# Patient Record
Sex: Female | Born: 1980 | Race: Black or African American | Hispanic: No | Marital: Single | Smoking: Never smoker
Health system: Southern US, Community
[De-identification: ages and names within clinical notes are randomized; demographics above are authoritative.]

## PROBLEM LIST (undated history)

## (undated) HISTORY — PX: TUBAL LIGATION: SHX77

---

## 2001-08-23 ENCOUNTER — Encounter: Payer: Self-pay | Admitting: Obstetrics and Gynecology

## 2001-08-23 ENCOUNTER — Ambulatory Visit (HOSPITAL_COMMUNITY): Admission: RE | Admit: 2001-08-23 | Discharge: 2001-08-23 | Payer: Self-pay | Admitting: Obstetrics and Gynecology

## 2001-12-03 ENCOUNTER — Ambulatory Visit (HOSPITAL_COMMUNITY): Admission: RE | Admit: 2001-12-03 | Discharge: 2001-12-03 | Payer: Self-pay | Admitting: Obstetrics and Gynecology

## 2001-12-03 ENCOUNTER — Encounter: Payer: Self-pay | Admitting: Obstetrics and Gynecology

## 2002-01-06 ENCOUNTER — Inpatient Hospital Stay (HOSPITAL_COMMUNITY): Admission: AD | Admit: 2002-01-06 | Discharge: 2002-01-06 | Payer: Self-pay | Admitting: Obstetrics and Gynecology

## 2002-01-10 ENCOUNTER — Encounter (HOSPITAL_COMMUNITY): Admission: RE | Admit: 2002-01-10 | Discharge: 2002-02-09 | Payer: Self-pay | Admitting: Obstetrics and Gynecology

## 2002-01-13 ENCOUNTER — Encounter: Payer: Self-pay | Admitting: Obstetrics and Gynecology

## 2002-01-21 ENCOUNTER — Inpatient Hospital Stay (HOSPITAL_COMMUNITY): Admission: AD | Admit: 2002-01-21 | Discharge: 2002-01-27 | Payer: Self-pay | Admitting: Obstetrics and Gynecology

## 2002-02-04 ENCOUNTER — Encounter: Payer: Self-pay | Admitting: Obstetrics and Gynecology

## 2002-02-04 ENCOUNTER — Ambulatory Visit (HOSPITAL_COMMUNITY): Admission: RE | Admit: 2002-02-04 | Discharge: 2002-02-04 | Payer: Self-pay | Admitting: Obstetrics and Gynecology

## 2002-03-04 ENCOUNTER — Other Ambulatory Visit: Admission: RE | Admit: 2002-03-04 | Discharge: 2002-03-04 | Payer: Self-pay | Admitting: Obstetrics and Gynecology

## 2002-10-07 ENCOUNTER — Ambulatory Visit (HOSPITAL_COMMUNITY): Admission: RE | Admit: 2002-10-07 | Discharge: 2002-10-07 | Payer: Self-pay | Admitting: *Deleted

## 2003-01-31 ENCOUNTER — Inpatient Hospital Stay (HOSPITAL_COMMUNITY): Admission: AD | Admit: 2003-01-31 | Discharge: 2003-02-02 | Payer: Self-pay | Admitting: *Deleted

## 2005-07-01 ENCOUNTER — Emergency Department (HOSPITAL_COMMUNITY): Admission: EM | Admit: 2005-07-01 | Discharge: 2005-07-01 | Payer: Self-pay | Admitting: Emergency Medicine

## 2005-07-22 ENCOUNTER — Emergency Department (HOSPITAL_COMMUNITY): Admission: EM | Admit: 2005-07-22 | Discharge: 2005-07-22 | Payer: Self-pay | Admitting: Emergency Medicine

## 2005-09-16 ENCOUNTER — Ambulatory Visit (HOSPITAL_COMMUNITY): Admission: RE | Admit: 2005-09-16 | Discharge: 2005-09-16 | Payer: Self-pay | Admitting: *Deleted

## 2005-10-08 ENCOUNTER — Ambulatory Visit (HOSPITAL_COMMUNITY): Admission: RE | Admit: 2005-10-08 | Discharge: 2005-10-08 | Payer: Self-pay | Admitting: *Deleted

## 2005-12-23 ENCOUNTER — Emergency Department (HOSPITAL_COMMUNITY): Admission: EM | Admit: 2005-12-23 | Discharge: 2005-12-23 | Payer: Self-pay | Admitting: Emergency Medicine

## 2006-02-03 ENCOUNTER — Ambulatory Visit: Payer: Self-pay | Admitting: *Deleted

## 2006-02-03 ENCOUNTER — Ambulatory Visit (HOSPITAL_COMMUNITY): Admission: RE | Admit: 2006-02-03 | Discharge: 2006-02-03 | Payer: Self-pay | Admitting: Obstetrics and Gynecology

## 2006-02-10 ENCOUNTER — Ambulatory Visit: Payer: Self-pay | Admitting: *Deleted

## 2006-02-12 ENCOUNTER — Ambulatory Visit: Payer: Self-pay | Admitting: Obstetrics and Gynecology

## 2006-02-12 ENCOUNTER — Inpatient Hospital Stay (HOSPITAL_COMMUNITY): Admission: AD | Admit: 2006-02-12 | Discharge: 2006-02-12 | Payer: Self-pay | Admitting: Obstetrics & Gynecology

## 2006-02-15 ENCOUNTER — Ambulatory Visit: Payer: Self-pay | Admitting: Obstetrics and Gynecology

## 2006-02-15 ENCOUNTER — Inpatient Hospital Stay (HOSPITAL_COMMUNITY): Admission: AD | Admit: 2006-02-15 | Discharge: 2006-02-18 | Payer: Self-pay | Admitting: Family Medicine

## 2006-02-16 ENCOUNTER — Ambulatory Visit: Payer: Self-pay | Admitting: Obstetrics and Gynecology

## 2006-09-16 IMAGING — US US OB TRANSVAGINAL MODIFY
1 series · 14 of 28 positions shown · non-contrast
Comparison: none

CLINICAL DATA: 24-year-old female with positive pregnancy test and pelvic pain with severe nausea and vomiting.  
 TRANSABDOMINAL AND TRANSVAGINAL COMPLETE OB ULTRASOUND < 14 WEEKS:

[Series 1: unknown · 0.27mm/px · 14 of 31 slices shown]
[im 2/31]
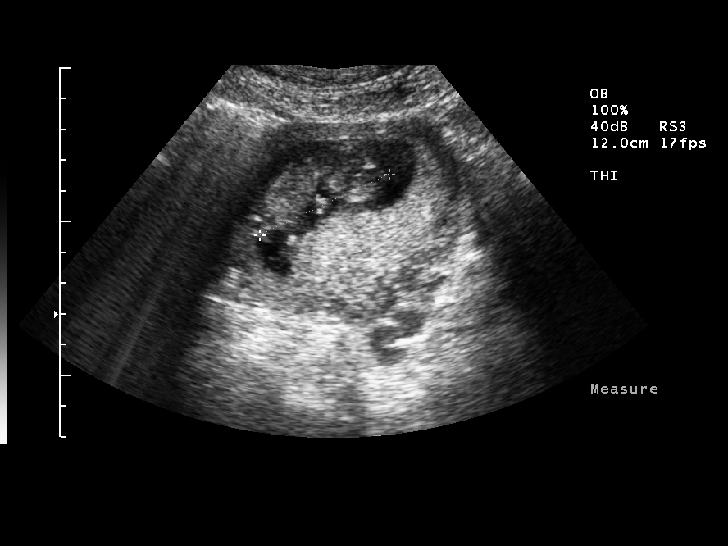
[im 4/31]
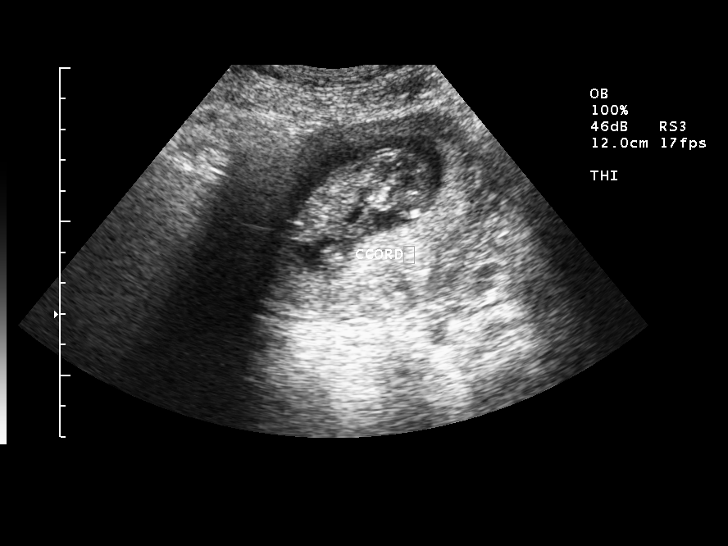
[im 6/31]
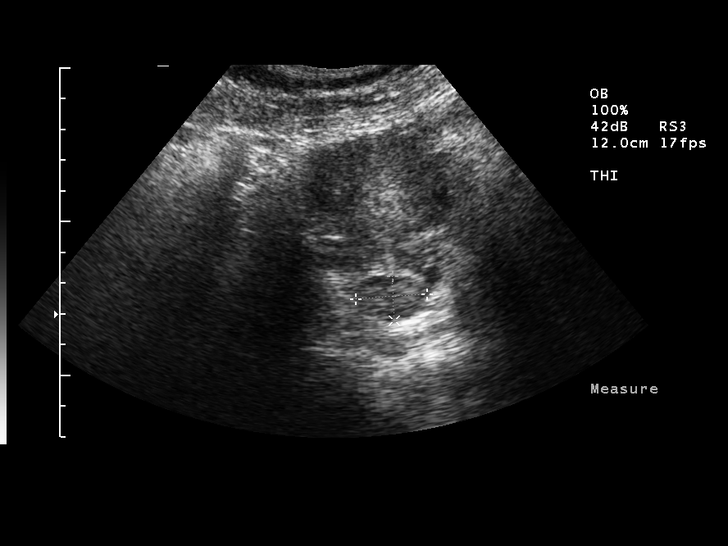
[im 8/31]
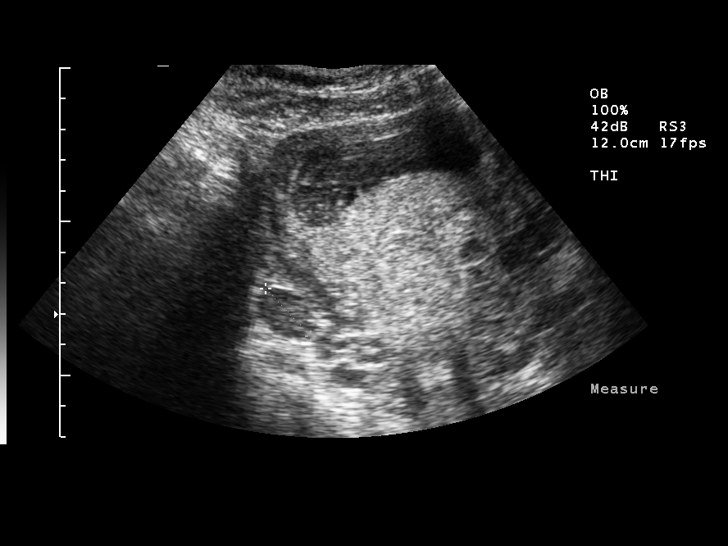
[im 11/31]
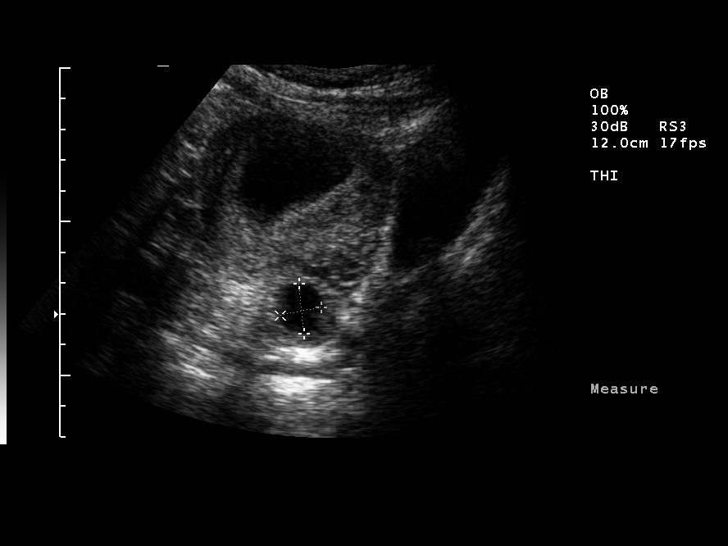
[im 13/31]
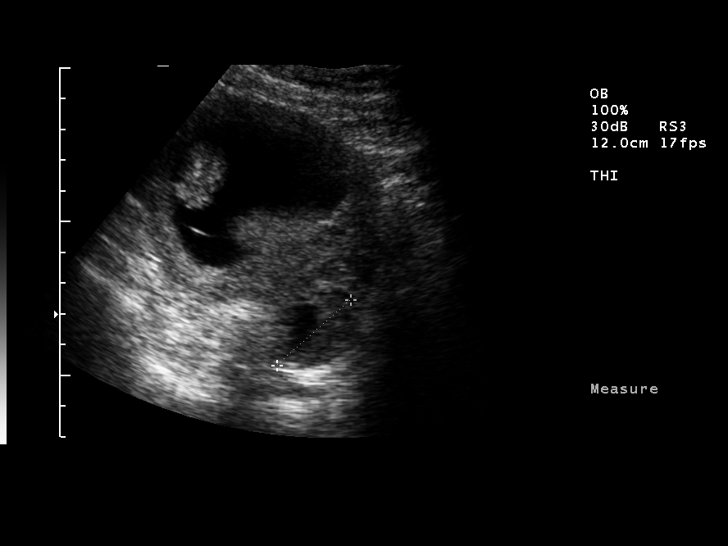
[im 15/31]
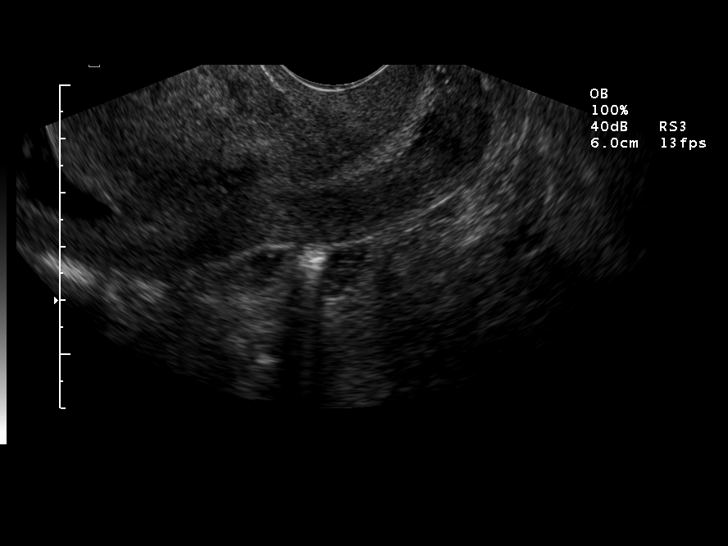
[im 17/31]
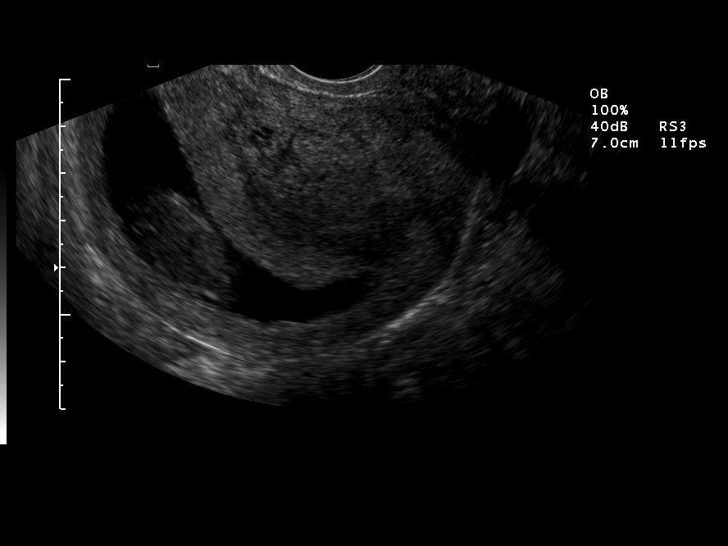
[im 19/31]
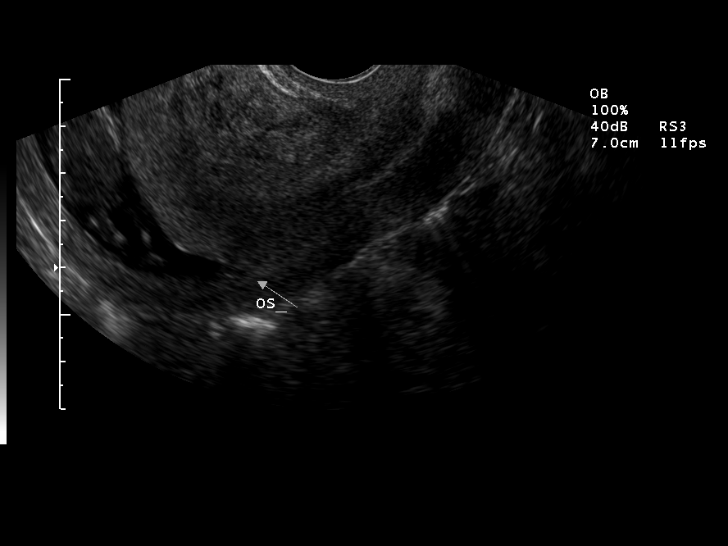
[im 22/31]
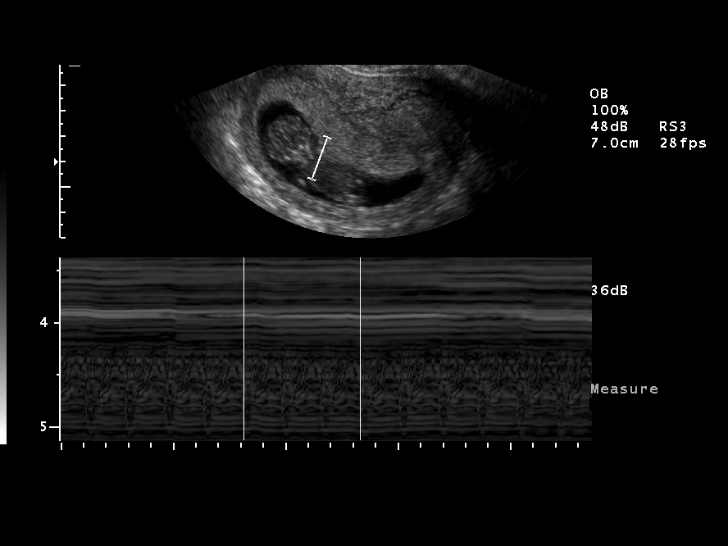
[im 24/31]
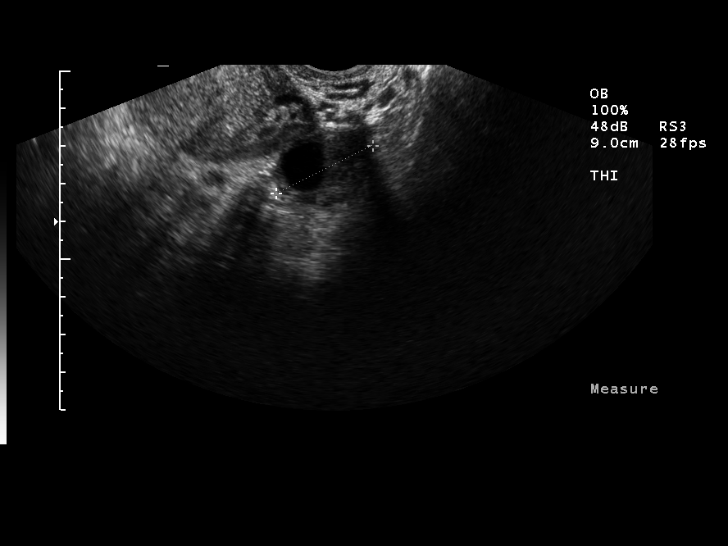
[im 26/31]
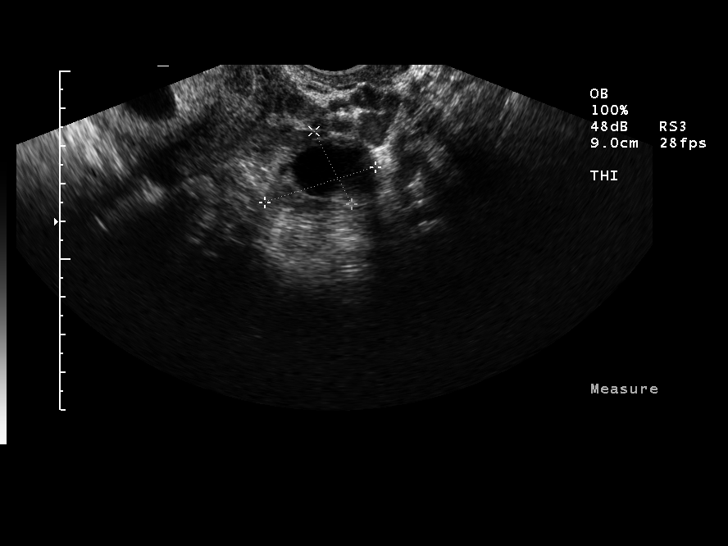
[im 28/31]
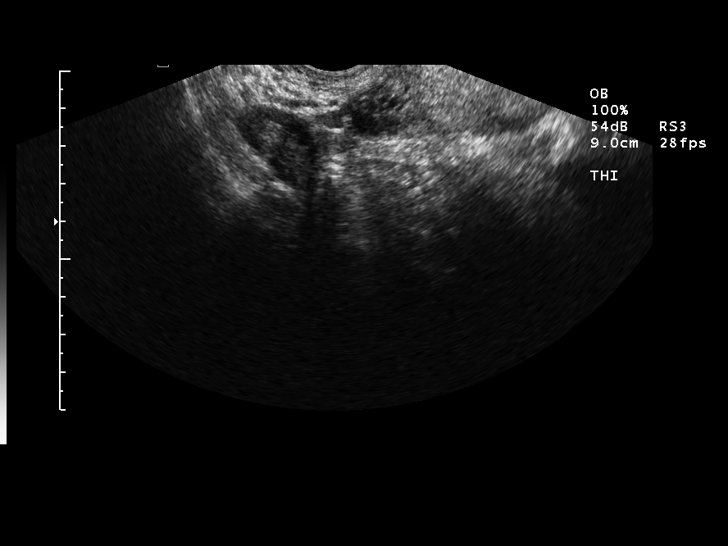
[im 31/31]
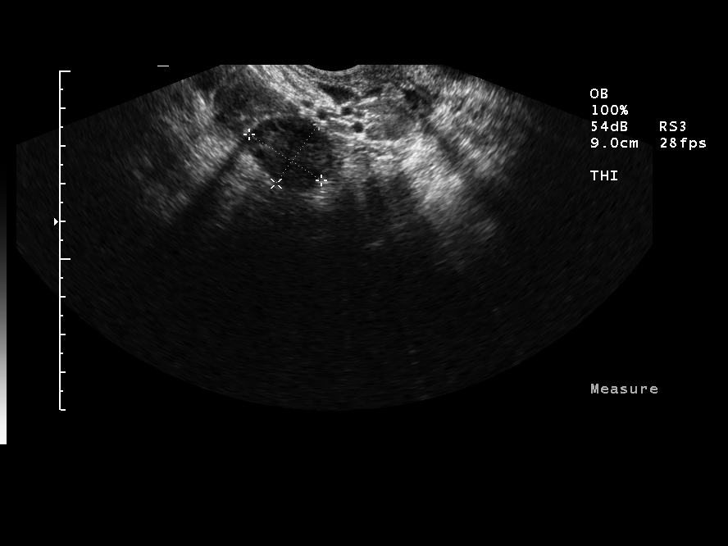

[14 of 28 positions shown; findings below may reference images not displayed]

FINDINGS: There is a single intrauterine gestation with crown rump length of 4.8 cm corresponding to a gestational age of 11 weeks 4 days.  Fetal heart rate measures 174 beats per minute.  Anterior low-lying placenta is identified extending to the margin of the internal os.  Normal amniotic fluid volume is identified.  There is no evidence of subchorionic hemorrhage.  The gestation is grossly unremarkable but please note this was not performed as a screening study.  Right ovary is normal.  Simple 1.6 cm cyst in the left ovary is noted.  No adnexal masses or free fluid identified.
IMPRESSION: 1.  Single living intrauterine gestation with estimated gestational age of 11 weeks 4 days by this ultrasound.  
 2.  No evidence of acute abnormality or subchorionic hemorrhage.  
 3.  Anterior low lying placenta to the margin of the internal os.  Follow-up recommended.
 4.  Please note that this was not performed as anatomy screen and recommend full evaluation 18-22 weeks.
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
## 2009-10-12 ENCOUNTER — Emergency Department (HOSPITAL_COMMUNITY): Admission: EM | Admit: 2009-10-12 | Discharge: 2009-10-13 | Payer: Self-pay | Admitting: Emergency Medicine

## 2010-04-26 ENCOUNTER — Emergency Department (HOSPITAL_COMMUNITY): Admission: EM | Admit: 2010-04-26 | Discharge: 2010-04-26 | Payer: Self-pay | Admitting: Emergency Medicine

## 2010-11-02 ENCOUNTER — Emergency Department (HOSPITAL_COMMUNITY): Admission: EM | Admit: 2010-11-02 | Discharge: 2010-11-02 | Payer: Self-pay | Admitting: Emergency Medicine

## 2011-03-20 LAB — URINALYSIS, ROUTINE W REFLEX MICROSCOPIC
Bilirubin Urine: NEGATIVE
Ketones, ur: NEGATIVE mg/dL
Nitrite: NEGATIVE

## 2011-03-20 LAB — POCT PREGNANCY, URINE: Preg Test, Ur: NEGATIVE

## 2011-03-20 LAB — URINE MICROSCOPIC-ADD ON

## 2011-05-02 NOTE — Discharge Summary (Signed)
[facility]  Patient:    Molly Clayton, Molly Clayton Visit Number: [id number] MRN: [id number]          Service Type: OBS Location: [facility] Attending Physician:  Malon Kindle Dictated by:   Malachi Pro. Ambrose Mantle, M.D. Admit Date:  01/21/2002 Disc. Date: 01/27/02                             Discharge Summary  HISTORY:                      This is a 30 year old black single female, para 0, gravida 1, last period Apr 22, 2001, [facility] January 23, 2002 by ultrasound and January 30, 2002 by dates, admitted in early labor.  PRENATAL LABORATORY DATA:     Blood group and type O positive with a negative antibody. Sickle cell positive; electrophoresis Hemoglobin AS. RPR nonreactive, rubella immune, hepatitis B surface antigen negative, HIV negative, GC and Chlamydia negative, triple screen normal. One-hour glucola 100. Group B strep negative. Cystic fibrosis screen negative.  Vaginal ultrasound on June 28, 2001: Crown-rump length 3.23 cm, 10 weeks    1 day, [facility] January 23, 2002. Repeat ultrasound August 23, 2001: Mean gestational age [redacted] weeks 5 days, [facility] January 19, 2002.  The patient had reactive nonstress tests for size less than dates. Late ultrasound showed normal amniotic fluid index and estimated fetal weight of 2900 g on January 13, 2002.  The patient came to Maternity Admission and the cervix changed under observation from 3 cm to 4 cm.  PAST MEDICAL HISTORY:  ALLERGIES:                    No known drug allergies.  OPERATIONS:                   None.  ILLNESSES:                    Usual childhood diseases. The patient had had Trichomonas and Chlamydia.  FAMILY HISTORY:               Paternal cousin with no fingers on one hand. Paternal aunt, Downs syndrome.  ALCOHOL, TOBACCO, AND DRUGS:  None.  PHYSICAL EXAMINATION:         Physical exam revealed normal vital signs. The abdomen showed a fundal height of 35 cm on January 20, 2002. Fetal heart  tones were normal. Contractions were coming every seven to eight minutes. The cervix was 4 cm, 80%, vertex at a -2, slightly posterior cervix. Artificial rupture of the membranes produced slightly meconium-stained fluid.  ADMISSION IMPRESSIONS:        1. Intrauterine pregnancy at 39+ weeks.                               2. Early labor.                               3. Meconium-stained fluid.  HOSPITAL COURSE:              By 11:40 a.m. the patient had received Stadol and Phenergan. The cervix was 6 cm. Epidural was in place. She was on low-dose Pitocin. By 5:55 p.m. the cervix was 10 cm. The vertex was at a +2 station. The patient pushed well  and had a spontaneous vaginal delivery of a living female infant, 6 pounds 9 ounces, Apgars of 9 at one and 9 at five minutes over a second degree laceration. Dr. Jackelyn Knife was in attendance. Nuchal cord x 1 was reduced. DeLee and bulb were used on the perineum. Clear fluid was obtained. The placenta was spontaneous. Membranes were removed with ring forceps. Second degree laceration was repaired with 3-0 Vicryl. Blood loss was less than 500 cc.  Postpartum, the patient did well until the evening of the first postpartum day when she did have sweats. Her temperature went to 100.4 degrees. On the evening of the second postpartum day, her temperature rose to 101.6 and she was begun on Unasyn. She actually felt fine. On the fourth postpartum day, her temperature was still 100.2 orally. Her only complaints were shakes and sweats and breast tenderness. There was no sore throat or leg pain. Heart showed normal sinus rhythm, no murmurs. Lungs were clear to P&A. Breasts showed bilateral engorgement and tenderness. There was no CVA tenderness. The abdomen was soft and nontender. The uterus was not tender. Legs were negative. The patient continued to be febrile so Dr. Senaida Ores placed her on gentamicin to add to the Unasyn and subsequently, the patients  temperature returned to normal and remained normal throughout the rest of her hospital course. She did state that she had some shakes and sweat at about 4 a.m. of the day of discharge; however, her temperature was not taken at that time.  On admission, her hemoglobin was 11.5, hematocrit 34.5, white count 9800, platelet count 191,000. Followup hemoglobin 10.7, hematocrit 32.3, white count 8200, platelet count 183,000. Urinalysis done on January 23, 2002 was negative. RPR was nonreactive.  FINAL DIAGNOSES:              1. Intrauterine pregnancy at 39 weeks, delivered                                  vertex.                               2. Meconium-stained fluid.                               3. Postpartum fever, uncertain origin.  OPERATIONS:                   Spontaneous vaginal delivery with repair                               of small laceration.  FINAL CONDITION:              Improved.  DISCHARGE INSTRUCTIONS:       Instructions include regular diet, our regular discharge instructions. She is to report any fever above 100 degrees, report any unusual problems.  DISCHARGE MEDICATIONS:        Augmentin 500 mg p.o. q.8h. for three days.  DISCHARGE FOLLOWUP:           The patient is to return to the office in one week for followup examination. Dictated by:   Malachi Pro. Ambrose Mantle, M.D. Attending Physician:  Malon Kindle DD:  01/27/02 TD:  01/27/02 Job: [id number]

## 2011-05-02 NOTE — Op Note (Signed)
Molly Clayton, Molly Clayton              ACCOUNT NO.:  [id number]   MEDICAL RECORD NO.:  [id number]          PATIENT TYPE:  INP   LOCATION:  9147                          FACILITY:  [facility]   PHYSICIAN:  Phil D. Okey Dupre, M.D.     DATE OF BIRTH:  May 25, 1981   DATE OF PROCEDURE:  02/17/2006  DATE OF DISCHARGE:                                 OPERATIVE REPORT   PREOPERATIVE DIAGNOSIS:  Multiparity, desires permanent sterilization.   POSTOPERATIVE DIAGNOSIS:  Multiparity, desires permanent sterilization.   PROCEDURE:  Postpartum tubal ligation with Filshie clips.   SURGEON:  Elinor Dodge, M.D.   ASSISTANT:  Carolanne Grumbling, M.D.   ANESTHESIA:  Spinal.   COMPLICATIONS:  None.   ESTIMATED BLOOD LOSS:  Minimal.   FLUIDS:  1000 mL of lactated Ringer's.   INDICATIONS:  The patient is a 30 year old G3, P3 status post normal  spontaneous vaginal delivery who desires permanent sterilization.  Risks and  benefits of the procedure were discussed with the patient including risk of  failure of 3-04/999 with increased risk of the ectopic gestation if  pregnancy occurs.   FINDINGS:  Normal uterus, tubes and ovaries.   DESCRIPTION OF PROCEDURE:  The patient was taken to the operating room where  a spinal anesthesia was placed and found to be adequate.  A small transverse  infraumbilical skin incision was then made with a scalpel.  The incision was  then carried down through the underlying fascia until the peritoneum was  identified and entered.  The peritoneum was noted be free of any an  adhesions, and the incision was extended with Metzenbaum scissors.  The  patient's right fallopian tube was then identified, brought through the  incision and grasped with Babcock clamps.  The tube was followed out to the  fimbriae.  Babcock clamp was then used to grasp the tube approximately 4 cm  from the cornual region.  The Filshie clip was placed.  It appeared to close  off the entire tube.  However, there was one  small fold so a second clip was  placed just to completely ensure that the procedure was adequate.  procedure  was adequate. Next, attention was turned to the left fallopian tube and the  tube was grasped with Babcock clamps and followed out to the fimbria.  Babcock clamp was then used to grasp the tube approximately 4 cm from the  cornual region and a Filshie clip was placed.  There was excellent  hemostasis, and the tubes were returned to the abdomen.   The peritoneum and fascia were closed with in a closed single layer using O  Vicryl.  The skin was closed in a subcuticular fashion using 4-0 PS Vicryl.   The patient tolerated the procedure well.  Sponge, lap and needle counts  were correct x2. The patient was taken to the recovery room in stable  condition.     ______________________________  Marc Morgans Mayford Knife, M.D.    ______________________________  Javier Glazier. Okey Dupre, M.D.    TLW/MEDQ  D:  02/17/2006  T:  02/18/2006  Job:  [id number]

## 2013-06-10 ENCOUNTER — Other Ambulatory Visit (HOSPITAL_COMMUNITY)
Admission: RE | Admit: 2013-06-10 | Discharge: 2013-06-10 | Disposition: A | Discharge status: Home / Self Care | Payer: No Typology Code available for payment source | Source: Ambulatory Visit | Attending: Emergency Medicine | Admitting: Emergency Medicine

## 2013-06-10 ENCOUNTER — Encounter (HOSPITAL_COMMUNITY): Payer: Self-pay

## 2013-06-10 ENCOUNTER — Emergency Department (INDEPENDENT_AMBULATORY_CARE_PROVIDER_SITE_OTHER)
Admission: EM | Admit: 2013-06-10 | Discharge: 2013-06-10 | Disposition: A | Discharge status: Home / Self Care | Payer: No Typology Code available for payment source | Source: Home / Self Care

## 2013-06-10 DIAGNOSIS — R102 Pelvic and perineal pain: Secondary | ICD-10-CM

## 2013-06-10 DIAGNOSIS — N760 Acute vaginitis: Secondary | ICD-10-CM | POA: Insufficient documentation

## 2013-06-10 DIAGNOSIS — N946 Dysmenorrhea, unspecified: Secondary | ICD-10-CM

## 2013-06-10 DIAGNOSIS — N9489 Other specified conditions associated with female genital organs and menstrual cycle: Secondary | ICD-10-CM

## 2013-06-10 DIAGNOSIS — Z113 Encounter for screening for infections with a predominantly sexual mode of transmission: Secondary | ICD-10-CM | POA: Insufficient documentation

## 2013-06-10 DIAGNOSIS — N949 Unspecified condition associated with female genital organs and menstrual cycle: Secondary | ICD-10-CM

## 2013-06-10 LAB — POCT URINALYSIS DIP (DEVICE)
Nitrite: NEGATIVE
Specific Gravity, Urine: 1.015 (ref 1.005–1.030)

## 2013-06-10 LAB — POCT PREGNANCY, URINE: Preg Test, Ur: NEGATIVE

## 2013-06-10 MED ORDER — AZITHROMYCIN 250 MG PO TABS: ORAL_TABLET | ORAL | Status: AC

## 2013-06-10 MED ORDER — CEFTRIAXONE SODIUM 1 G IJ SOLR
INTRAMUSCULAR | Status: AC
  Filled 2013-06-10: qty 10

## 2013-06-10 MED ORDER — CEFTRIAXONE SODIUM 250 MG IJ SOLR
INTRAMUSCULAR | Status: AC
  Filled 2013-06-10: qty 250

## 2013-06-10 MED ORDER — CEFTRIAXONE SODIUM 250 MG IJ SOLR
250.0000 mg | Freq: Once | INTRAMUSCULAR | Status: AC
  Administered 2013-06-10: 250 mg via INTRAMUSCULAR

## 2013-06-10 MED ORDER — NAPROXEN 500 MG PO TBEC: 500.0000 mg | DELAYED_RELEASE_TABLET | Freq: Two times a day (BID) | ORAL | Status: AC

## 2013-06-10 NOTE — ED Provider Notes (Addendum)
History    CSN: [id number] Arrival date & time 06/10/13  1413  First MD Initiated Contact with Patient 06/10/13 1507     Chief Complaint  Patient presents with  . Abdominal Pain   (Consider location/radiation/quality/duration/timing/severity/associated sxs/prior Treatment) HPI Comments: 32 year old female presents with right pelvic pain since last night. She says it became worse this morning. She began her menses 2 days ago and this was followed by suprapubic cramping. She is complaining of mild nausea but no vomiting or fever. She states she has a normal appetite. Denies urinary symptoms. Pregnancy test is negative  History reviewed. No pertinent past medical history. History reviewed. No pertinent past surgical history. No family history on file. History  Substance Use Topics  . Smoking status: Not on file  . Smokeless tobacco: Not on file  . Alcohol Use: Not on file   OB History   Grav Para Term Preterm Abortions TAB SAB Ect Mult Living                 Review of Systems  Constitutional: Positive for activity change. Negative for fever.  HENT: Negative.   Respiratory: Negative.   Cardiovascular: Negative.   Gastrointestinal: Positive for nausea and abdominal pain. Negative for vomiting and constipation.  Genitourinary: Positive for vaginal bleeding and pelvic pain. Negative for dysuria, difficulty urinating and menstrual problem.  Skin: Negative.   Neurological: Negative.     Allergies  Review of patient's allergies indicates no known allergies.  Home Medications   Current Outpatient Rx  Name  Route  Sig  Dispense  Refill  . azithromycin (ZITHROMAX) 250 MG tablet      Take all 4 tablets today   4 each   0   . naproxen (EC-NAPROSYN) 500 MG EC tablet   Oral   Take 1 tablet (500 mg total) by mouth 2 (two) times daily with a meal. Prn cramps.   16 tablet   0    BP 122/78  Pulse 76  Temp(Src) 98.5 F (36.9 C) (Oral)  Resp 16  SpO2 100% Physical Exam   Nursing note and vitals reviewed. Constitutional: She is oriented to person, place, and time. She appears well-developed and well-nourished. No distress.  Neck: Normal range of motion. Neck supple.  Cardiovascular: Normal rate, regular rhythm and normal heart sounds.   Pulmonary/Chest: Effort normal and breath sounds normal. No respiratory distress.  Abdominal: Soft. Bowel sounds are normal. She exhibits no distension and no mass. There is tenderness. There is no rebound and no guarding.  Tenderness in the lower most right quadrant and pelvis. Also tenderness in the mid pelvis/suprapubic area.  Genitourinary:  Moderate bleeding associated with menses. Cervix is midposition posterior in the vagina and cervix is filled with blood. After removal of some of the blood the ectocervix is barely visible there were no lesions observed. The ectocervix that was visible and the cervix was pain. Bimanual: Mild CMT and moderate right adnexal tenderness. Reevaluation of the pain associated with the bimanual exam with compression of the right lower quadrant of the abdomen and does not produce pain or tenderness.  Neurological: She is alert and oriented to person, place, and time.  Skin: Skin is warm and dry.  Psychiatric: She has a normal mood and affect.    ED Course  Procedures (including critical care time) Labs Reviewed  POCT URINALYSIS DIP (DEVICE) - Abnormal; Notable for the following:    Hgb urine dipstick MODERATE (*)    Leukocytes, UA TRACE (*)  All other components within normal limits  POCT PREGNANCY, URINE  CERVICOVAGINAL ANCILLARY ONLY   Results for orders placed during the hospital encounter of 06/10/13  POCT URINALYSIS DIP (DEVICE)      Result Value Range   Glucose, UA NEGATIVE  NEGATIVE mg/dL   Bilirubin Urine NEGATIVE  NEGATIVE   Ketones, ur NEGATIVE  NEGATIVE mg/dL   Specific Gravity, Urine 1.015  1.005 - 1.030   Hgb urine dipstick MODERATE (*) NEGATIVE   pH 6.5  5.0 - 8.0    Protein, ur NEGATIVE  NEGATIVE mg/dL   Urobilinogen, UA 0.2  0.0 - 1.0 mg/dL   Nitrite NEGATIVE  NEGATIVE   Leukocytes, UA TRACE (*) NEGATIVE  POCT PREGNANCY, URINE      Result Value Range   Preg Test, Ur NEGATIVE  NEGATIVE    No results found. 1. Pelvic pain   2. Uterine adnexal pain   3. Painful menstrual flow     MDM  The physical exam and in particular the pelvic exam is not suggestive of right lower quadrant/appendiceal tenderness or pain. The pain and tenderness is arising from the pelvic structures. Treat with Rocephin 250 mg IM and Rx for azithromycin 1 g by mouth Cervical ancillary swabs obtained. Results pending and will treat accordingly. Per any new symptoms problems or worsening, worsening of pain, nausea vomiting, fever or other symptoms go directly to the emergency department. Info for abdominal pain and possible appendicitis is given to to the pt.  Naprosyn ec 500 bid with food prn cramps and pain. Flagyl BID for 7 d. Note entered 06/16/13. DM  Hayden Rasmussen, NP 06/10/13 1627  Hayden Rasmussen, NP 06/16/13 [phone]

## 2013-06-10 NOTE — ED Notes (Addendum)
C/o pain RLQ  w nausea. Menses at present, LMP prior to this one was 6 weeks ago; sexually active, tubal ligation for [facility].

## 2013-06-10 NOTE — ED Provider Notes (Signed)
Medical screening examination/treatment/procedure(s) were performed by non-physician practitioner and as supervising physician I was immediately available for consultation/collaboration.  Leslee Home, M.D.  Reuben Likes, MD 06/10/13 [phone]

## 2013-06-14 ENCOUNTER — Telehealth (HOSPITAL_COMMUNITY): Payer: Self-pay | Admitting: *Deleted

## 2013-06-14 NOTE — ED Notes (Signed)
GC/Chlamydia neg., Affirm: Candida and Trich neg., Gardnerella pos. Hayden Rasmussen NP gave order for Flagyl 500 mg. BID x 7 days.  I called pt.  Pt. verified x 2 and given results.  Pt. told she needs Flagyl for bacterial vaginosis.  Pt.'s questions about bacterial vaginosis answered.   Pt. instructed to no alcohol while taking this medication.  Pt. wants Rx. called to [facility] on [address]. Pt. voiced understanding.  I called pharmacist @ [phone] and gave order as previously noted. Vassie Moselle 06/14/2013

## 2013-06-16 NOTE — ED Provider Notes (Signed)
Medical screening examination/treatment/procedure(s) were performed by non-physician practitioner and as supervising physician I was immediately available for consultation/collaboration.  Arlee Bossard, M.D.  Porsche Noguchi C Hamdan Toscano, MD 06/16/13 1935 

## 2014-11-20 ENCOUNTER — Encounter (HOSPITAL_COMMUNITY): Payer: Self-pay

## 2014-11-20 ENCOUNTER — Emergency Department (HOSPITAL_COMMUNITY)
Admission: EM | Admit: 2014-11-20 | Discharge: 2014-11-21 | Disposition: A | Discharge status: Home / Self Care | Payer: No Typology Code available for payment source | Attending: Emergency Medicine | Admitting: Emergency Medicine

## 2014-11-20 ENCOUNTER — Emergency Department (HOSPITAL_COMMUNITY): Payer: No Typology Code available for payment source

## 2014-11-20 DIAGNOSIS — Z792 Long term (current) use of antibiotics: Secondary | ICD-10-CM | POA: Insufficient documentation

## 2014-11-20 DIAGNOSIS — Z791 Long term (current) use of non-steroidal anti-inflammatories (NSAID): Secondary | ICD-10-CM | POA: Insufficient documentation

## 2014-11-20 DIAGNOSIS — R0789 Other chest pain: Secondary | ICD-10-CM

## 2014-11-20 DIAGNOSIS — R202 Paresthesia of skin: Secondary | ICD-10-CM

## 2014-11-20 DIAGNOSIS — Z9851 Tubal ligation status: Secondary | ICD-10-CM | POA: Diagnosis not present

## 2014-11-20 DIAGNOSIS — N938 Other specified abnormal uterine and vaginal bleeding: Secondary | ICD-10-CM | POA: Insufficient documentation

## 2014-11-20 DIAGNOSIS — R079 Chest pain, unspecified: Secondary | ICD-10-CM

## 2014-11-20 LAB — BASIC METABOLIC PANEL
Anion gap: 14 (ref 5–15)
BUN: 8 mg/dL (ref 6–23)
CALCIUM: 9.1 mg/dL (ref 8.4–10.5)
CHLORIDE: 102 meq/L (ref 96–112)
CO2: 23 mEq/L (ref 19–32)
CREATININE: 0.95 mg/dL (ref 0.50–1.10)
GFR calc Af Amer: 89 mL/min — ABNORMAL LOW (ref >90)
GFR calc non Af Amer: 77 mL/min — ABNORMAL LOW (ref >90)
GLUCOSE: 103 mg/dL — AB (ref 70–99)
POTASSIUM: 3.8 meq/L (ref 3.7–5.3)
SODIUM: 139 meq/L (ref 137–147)

## 2014-11-20 LAB — CBC
HCT: 35.3 % — ABNORMAL LOW (ref 36.0–46.0)
Hemoglobin: 12.1 g/dL (ref 12.0–15.0)
MCH: 28.2 pg (ref 26.0–34.0)
MCHC: 34.3 g/dL (ref 30.0–36.0)
MCV: 82.3 fL (ref 78.0–100.0)
PLATELETS: 252 10*3/uL (ref 150–400)
RBC: 4.29 MIL/uL (ref 3.87–5.11)
RDW: 13.4 % (ref 11.5–15.5)
WBC: 7.5 10*3/uL (ref 4.0–10.5)

## 2014-11-20 LAB — I-STAT TROPONIN, ED
Troponin i, poc: 0 ng/mL (ref 0.00–0.08)
Troponin i, poc: 0 ng/mL (ref 0.00–0.08)

## 2014-11-20 MED ORDER — GI COCKTAIL ~~LOC~~
30.0000 mL | Freq: Once | ORAL | Status: AC
Start: 2014-11-20 — End: 2014-11-20
  Administered 2014-11-20: 30 mL via ORAL
  Filled 2014-11-20: qty 30

## 2014-11-20 MED ORDER — RANITIDINE HCL 150 MG PO TABS: 150.0000 mg | ORAL_TABLET | Freq: Two times a day (BID) | ORAL | Status: AC | PRN

## 2014-11-20 MED ORDER — SODIUM CHLORIDE 0.9 % IV BOLUS (SEPSIS)
1000.0000 mL | Freq: Once | INTRAVENOUS | Status: AC
  Administered 2014-11-20: 1000 mL via INTRAVENOUS

## 2014-11-20 MED ORDER — MORPHINE SULFATE 4 MG/ML IJ SOLN
4.0000 mg | Freq: Once | INTRAMUSCULAR | Status: AC
  Administered 2014-11-20: 4 mg via INTRAVENOUS
  Filled 2014-11-20: qty 1

## 2014-11-20 MED ORDER — LORAZEPAM 1 MG PO TABS: 1.0000 mg | ORAL_TABLET | Freq: Four times a day (QID) | ORAL | Status: AC | PRN

## 2014-11-20 MED ORDER — LORAZEPAM 2 MG/ML IJ SOLN
0.5000 mg | Freq: Once | INTRAMUSCULAR | Status: AC
  Administered 2014-11-20: 0.5 mg via INTRAVENOUS
  Filled 2014-11-20: qty 1

## 2014-11-20 NOTE — Discharge Instructions (Signed)
Your chest pain doesn't appear to be related to your heart. Your lab values have all been normal and your work up did not reveal a source. It could be caused by indigestion/heartburn therefore try using zantac or maalox as directed as needed for these symptoms. Stay well hydrated. The pain could be related to anxiety, try using ativan as directed as needed but avoid driving or operating heavy machinery while taking this medication. Use the list below to find a regular doctor, and see them within 1 week for ongoing management of your intermittent chest tightness. Return to the ER for changes or worsening symptoms.   Chest Pain (Nonspecific) It is often hard to give a specific diagnosis for the cause of chest pain. There is always a chance that your pain could be related to something serious, such as a heart attack or a blood clot in the lungs. You need to follow up with your health care provider for further evaluation. CAUSES   Heartburn.  Pneumonia or bronchitis.  Anxiety or stress.  Inflammation around your heart (pericarditis) or lung (pleuritis or pleurisy).  A blood clot in the lung.  A collapsed lung (pneumothorax). It can develop suddenly on its own (spontaneous pneumothorax) or from trauma to the chest.  Shingles infection (herpes zoster virus). The chest wall is composed of bones, muscles, and cartilage. Any of these can be the source of the pain.  The bones can be bruised by injury.  The muscles or cartilage can be strained by coughing or overwork.  The cartilage can be affected by inflammation and become sore (costochondritis). DIAGNOSIS  Lab tests or other studies may be needed to find the cause of your pain. Your health care provider may have you take a test called an ambulatory electrocardiogram (ECG). An ECG records your heartbeat patterns over a 24-hour period. You may also have other tests, such as:  Transthoracic echocardiogram (TTE). During echocardiography, sound  waves are used to evaluate how blood flows through your heart.  Transesophageal echocardiogram (TEE).  Cardiac monitoring. This allows your health care provider to monitor your heart rate and rhythm in real time.  Holter monitor. This is a portable device that records your heartbeat and can help diagnose heart arrhythmias. It allows your health care provider to track your heart activity for several days, if needed.  Stress tests by exercise or by giving medicine that makes the heart beat faster. TREATMENT   Treatment depends on what may be causing your chest pain. Treatment may include:  Acid blockers for heartburn.  Anti-inflammatory medicine.  Pain medicine for inflammatory conditions.  Antibiotics if an infection is present.  You may be advised to change lifestyle habits. This includes stopping smoking and avoiding alcohol, caffeine, and chocolate.  You may be advised to keep your head raised (elevated) when sleeping. This reduces the chance of acid going backward from your stomach into your esophagus. Most of the time, nonspecific chest pain will improve within 2-3 days with rest and mild pain medicine.  HOME CARE INSTRUCTIONS   If antibiotics were prescribed, take them as directed. Finish them even if you start to feel better.  For the next few days, avoid physical activities that bring on chest pain. Continue physical activities as directed.  Do not use any tobacco products, including cigarettes, chewing tobacco, or electronic cigarettes.  Avoid drinking alcohol.  Only take medicine as directed by your health care provider.  Follow your health care provider's suggestions for further testing if your chest pain  does not go away.  Keep any follow-up appointments you made. If you do not go to an appointment, you could develop lasting (chronic) problems with pain. If there is any problem keeping an appointment, call to reschedule. SEEK MEDICAL CARE IF:   Your chest pain  does not go away, even after treatment.  You have a rash with blisters on your chest.  You have a fever. SEEK IMMEDIATE MEDICAL CARE IF:   You have increased chest pain or pain that spreads to your arm, neck, jaw, back, or abdomen.  You have shortness of breath.  You have an increasing cough, or you cough up blood.  You have severe back or abdominal pain.  You feel nauseous or vomit.  You have severe weakness.  You faint.  You have chills. This is an emergency. Do not wait to see if the pain will go away. Get medical help at once. Call your local emergency services (911 in [place] Do not drive yourself to the hospital. MAKE SURE YOU:   Understand these instructions.  Will watch your condition.  Will get help right away if you are not doing well or get worse. Document Released: 09/10/2005 Document Revised: 12/06/2013 Document Reviewed: 07/06/2008 [facility] Patient Information 2015 [place], [place]. This information is not intended to replace advice given to you by your health care provider. Make sure you discuss any questions you have with your health care provider.  Paresthesia Paresthesia is an abnormal burning or prickling sensation. This sensation is generally felt in the hands, arms, legs, or feet. However, it may occur in any part of the body. It is usually not painful. The feeling may be described as:  Tingling or numbness.  "Pins and needles."  Skin crawling.  Buzzing.  Limbs "falling asleep."  Itching. Most people experience temporary (transient) paresthesia at some time in their lives. CAUSES  Paresthesia may occur when you breathe too quickly (hyperventilation). It can also occur without any apparent cause. Commonly, paresthesia occurs when pressure is placed on a nerve. The feeling quickly goes away once the pressure is removed. For some people, however, paresthesia is a long-lasting (chronic) condition caused by an underlying disorder. The underlying disorder  may be:  A traumatic, direct injury to nerves. Examples include a:  Broken (fractured) neck.  Fractured skull.  A disorder affecting the brain and spinal cord (central nervous system). Examples include:  Transverse myelitis.  Encephalitis.  Transient ischemic attack.  Multiple sclerosis.  Stroke.  Tumor or blood vessel problems, such as an arteriovenous malformation pressing against the brain or spinal cord.  A condition that damages the peripheral nerves (peripheral neuropathy). Peripheral nerves are not part of the brain and spinal cord. These conditions include:  Diabetes.  Peripheral vascular disease.  Nerve entrapment syndromes, such as carpal tunnel syndrome.  Shingles.  Hypothyroidism.  Vitamin B12 deficiencies.  Alcoholism.  Heavy metal poisoning (lead, arsenic).  Rheumatoid arthritis.  Systemic lupus erythematosus. DIAGNOSIS  Your caregiver will attempt to find the underlying cause of your paresthesia. Your caregiver may:  Take your medical history.  Perform a physical exam.  Order various lab tests.  Order imaging tests. TREATMENT  Treatment for paresthesia depends on the underlying cause. HOME CARE INSTRUCTIONS  Avoid drinking alcohol.  You may consider massage or acupuncture to help relieve your symptoms.  Keep all follow-up appointments as directed by your caregiver. SEEK IMMEDIATE MEDICAL CARE IF:   You feel weak.  You have trouble walking or moving.  You have problems with  speech or vision.  You feel confused.  You cannot control your bladder or bowel movements.  You feel numbness after an injury.  You faint.  Your burning or prickling feeling gets worse when walking.  You have pain, cramps, or dizziness.  You develop a rash. MAKE SURE YOU:  Understand these instructions.  Will watch your condition.  Will get help right away if you are not doing well or get worse. Document Released: 11/21/2002 Document Revised:  02/23/2012 Document Reviewed: 08/22/2011 [facility] Patient Information 2015 [place], [place]. This information is not intended to replace advice given to you by your health care provider. Make sure you discuss any questions you have with your health care provider. Emergency Department Resource Guide 1) Find a Doctor and Pay Out of Pocket Although you won't have to find out who is covered by your insurance plan, it is a good idea to ask around and get recommendations. You will then need to call the office and see if the doctor you have chosen will accept you as a new patient and what types of options they offer for patients who are self-pay. Some doctors offer discounts or will set up payment plans for their patients who do not have insurance, but you will need to ask so you aren't surprised when you get to your appointment.  2) Contact Your Local Health Department Not all health departments have doctors that can see patients for sick visits, but many do, so it is worth a call to see if yours does. If you don't know where your local health department is, you can check in your phone book. The CDC also has a tool to help you locate your state's health department, and many state websites also have listings of all of their local health departments.  3) Find a Walk-in Clinic If your illness is not likely to be very severe or complicated, you may want to try a walk in clinic. These are popping up all over the country in pharmacies, drugstores, and shopping centers. They're usually staffed by nurse practitioners or physician assistants that have been trained to treat common illnesses and complaints. They're usually fairly quick and inexpensive. However, if you have serious medical issues or chronic medical problems, these are probably not your best option.  No Primary Care Doctor: - Call [facility] Connect at  [phone] - they can help you locate a primary care doctor that  accepts your insurance, provides certain  services, etc. - Physician Referral Service- [phone]  Chronic Pain Problems: Organization         Address  Phone   Notes  [facility]  [phone] Patients need to be referred by their primary care doctor.   Medication Assistance: Organization         Address  Phone   Notes  [facility] Medication [facility] [address] [place], [place] [address] [phone] --Must be a resident of [facility] -- Must have NO insurance coverage whatsoever (no Medicaid/ Medicare, etc.) -- The pt. MUST have a primary care doctor that directs their care regularly and follows them in the community   [facility]  [phone]   [employer]  [phone]    Agencies that provide inexpensive medical care: Organization         Address  Phone   Notes  [facility]  [phone]   [facility]    [phone]   [facility] [address] [place], [place] [id number] [phone]) [phone]   [facility] [place] [address], [place] [phone]   [facility]    [phone]) [phone]   [facility]    [phone]) [phone]   [facility]  [address], [place] Phone:  [phone], Fax:  [phone] Hours of Operation:  9 am - 6 pm, M-F.  Also accepts Medicaid/Medicare and self-pay.  [facility]  [address], [place] Phone: [phone]) [phone], Fax: [phone]) [phone]. Hours of Operation:  8:30 am - 5:30 pm, M-F.  Also accepts Medicaid and self-pay.  [facility] [place] [address], [place] Phone: [phone]) [phone]   [facility] [address], [place], [place] [phone], Ext. [phone] Mondays & Thursdays: 7-9 AM.  First 15 patients are seen on a first come, first serve basis.    Medicaid-accepting [place] [facility] Providers:  Organization         Address  Phone   Notes  [facility] [address], [place] [phone]) [phone] Also accepts self-pay patients.  [facility] [address], [place]  [phone]   [facility] [address], [place] [phone]) [phone]   [facility] [address] [place] [address], [place] [phone]   Renaye RakersVeita Bland [address], [place]   [phone]) [phone] Only accepts [place] patients after they have their name applied to their card.   Self-Pay (no insurance) in [place] [facility]:  Organization         Address  Phone   Notes  Sickle Cell Patients, [facility] [address], [place] [phone]) [phone]   [facility] [address], [place] [phone](336) [phone]   [facility]  [address], [place] [phone](336) [phone]   [facility]/Dr. Osei-Bonsu  [address] [place] [address], [place] or [id number], [place] [phone] Phone number for both [place] and [place] locations is the same.  [facility] [address], [place] [phone]   [facility]  [address] [place] [address], [place] or [address] [phone]   [facility] [address], [place] [phone]) [phone], phone; [phone]) [phone], fax Sees patients 1st and 3rd Saturday of every month.  Must not qualify for public or private insurance (i.e. Medicaid, Medicare, [place] Health Choice, Veterans' Benefits)  Household income should be no more than 200% of the poverty level The clinic cannot treat you if you are pregnant or think you are pregnant  Sexually transmitted diseases are not treated at the clinic.

## 2014-11-20 NOTE — ED Notes (Signed)
Pt. Reports chest tightness starting this AM with radiation to left arm. States she has had intermittent chest tightness for the past year. Previously seen for it with no significant findings. Pt. Also c/o intermittent SOB associated with chest pain. Pt. Is alert and oriented, no distress noted at triage.

## 2014-11-20 NOTE — ED Provider Notes (Signed)
CSN: [id number]     Arrival date & time 11/20/14  1909 History   First MD Initiated Contact with Patient 11/20/14 2146     Chief Complaint  Patient presents with  . Chest Pain     (Consider location/radiation/quality/duration/timing/severity/associated sxs/prior Treatment) HPI Comments: Molly Clayton is a 33 y.o. female with a PMHx of HTN but not currently on any antihypertensives, who presents to the ED with complaints of chest tightness that has been intermittent x1 yr but then became constant at 6:45pm and was accompanied by L arm paresthesia which spontaneously resolved. Patient states that her chest tightness occurred while she was driving at rest, and has been constant since 6:45 PM, located in the central part of her chest, nonradiating, with no known aggravating or alleviating factors, and unrelieved with ibuprofen and Bayer. She states the tightness sensation has been occurring for 1 year, but she has never had paresthesias in her left arm which is what prompted her to be evaluated today. She denies that the chest tightness changes with inspiration, or radiated to her jaw/neck/back at any point in time. She denies any fevers, chills, URI symptoms, diaphoresis, vision changes, dizziness, lightheadedness, HA, shortness of breath, wheezing, cough, hemoptysis, lower extremity swelling, abdominal pain, nausea, vomiting, diarrhea, constipation, obstipation, dysuria, hematuria, vaginal discharge, melena, hematochezia, back pain, arthralgias, myalgias, numbness, weakness, focal neurologic deficits, anxiety, reflux/heartburn, claudication, palpitations, PND, orthopnea, history of DVT/PE/cardiac disease, family history of cardiac disease/DVT/PE, recent travel/immobilization/surgeries, estrogen use, or sick contacts. She was previously told she had HTN but the medication she was started on was expensive, and she hasn't gone back to her PCP for a change in meds. LMP started yesterday, currently on menses,  and states her menses is "normal" for her without any increased vaginal bleeding. States she has had a tubal ligation.  Patient is a 33 y.o. female presenting with chest pain. The history is provided by the patient. No language interpreter was used.  Chest Pain Pain location:  Substernal area Pain quality: tightness   Pain radiates to:  Does not radiate Pain radiates to the back: no   Pain severity:  Moderate (6/10) Onset quality:  Gradual Duration:  4 hours Timing:  Constant Progression:  Waxing and waning Chronicity:  Recurrent Context: at rest   Context: not breathing, not eating and no movement   Relieved by:  None tried Worsened by:  Nothing tried Ineffective treatments: ibuprofen and bayer. Associated symptoms: no abdominal pain, no altered mental status, no anxiety, no back pain, no claudication, no cough, no diaphoresis, no dizziness, no dysphagia, no fever, no headache, no heartburn, no lower extremity edema, no nausea, no near-syncope, no numbness, no orthopnea, no palpitations, no PND, no shortness of breath, no syncope, not vomiting and no weakness   Risk factors: hypertension   Risk factors: no diabetes mellitus, no high cholesterol, no immobilization, no prior DVT/PE, no smoking and no surgery     History reviewed. No pertinent past medical history. Past Surgical History  Procedure Laterality Date  . Tubal ligation     No family history on file. History  Substance Use Topics  . Smoking status: Not on file  . Smokeless tobacco: Not on file  . Alcohol Use: Not on file   OB History    No data available     Review of Systems  Constitutional: Negative for fever, chills and diaphoresis.  HENT: Negative for congestion, sinus pressure and trouble swallowing.   Eyes: Negative for visual disturbance.  Respiratory:  Positive for chest tightness. Negative for cough, shortness of breath and wheezing.   Cardiovascular: Positive for chest pain. Negative for palpitations,  orthopnea, claudication, leg swelling, syncope, PND and near-syncope.  Gastrointestinal: Negative for heartburn, nausea, vomiting, abdominal pain, diarrhea, constipation and blood in stool.  Genitourinary: Positive for vaginal bleeding (on menses). Negative for dysuria, urgency, frequency, hematuria, flank pain, vaginal discharge, menstrual problem and pelvic pain.  Musculoskeletal: Negative for myalgias, back pain, arthralgias, neck pain and neck stiffness.  Skin: Negative for color change.  Neurological: Negative for dizziness, tremors, syncope, weakness, light-headedness, numbness and headaches.  Psychiatric/Behavioral: Negative for confusion.   10 Systems reviewed and are negative for acute change except as noted in the HPI.    Allergies  Review of patient's allergies indicates no known allergies.  Home Medications   Prior to Admission medications   Medication Sig Start Date End Date Taking? Authorizing Provider  Aspirin-Acetaminophen (GOODYS BODY PAIN PO) Take 1 packet by mouth daily as needed (pain).   Yes Historical Provider, MD  aspirin-acetaminophen-caffeine (EXCEDRIN MIGRAINE) [phone] MG per tablet Take 2 tablets by mouth every 6 (six) hours as needed for headache.   Yes Historical Provider, MD  azithromycin (ZITHROMAX) 250 MG tablet Take all 4 tablets today Patient not taking: Reported on 11/20/2014 06/10/13   Hayden Rasmussen, NP  naproxen (EC-NAPROSYN) 500 MG EC tablet Take 1 tablet (500 mg total) by mouth 2 (two) times daily with a meal. Prn cramps. Patient not taking: Reported on 11/20/2014 06/10/13   Hayden Rasmussen, NP   BP 139/75 mmHg  Pulse 61  Temp(Src) 98.3 F (36.8 C) (Oral)  Resp 11  SpO2 100% Physical Exam  Constitutional: She is oriented to person, place, and time. Vital signs are normal. She appears well-developed and well-nourished.  Non-toxic appearance. No distress.  Afebrile, nontoxic, NAD, VSS  HENT:  Head: Normocephalic and atraumatic.  Mouth/Throat: Oropharynx  is clear and moist and mucous membranes are normal.  Eyes: Conjunctivae and EOM are normal. Right eye exhibits no discharge. Left eye exhibits no discharge.  Neck: Normal range of motion. Neck supple. No JVD present. No spinous process tenderness and no muscular tenderness present. No rigidity. Normal range of motion present.  No JVD FROM intact without spinous process or paraspinous muscle TTP, no bony stepoffs or deformities, no muscle spasms. No rigidity or meningeal signs. No bruising or swelling.  Cardiovascular: Normal rate, regular rhythm, normal heart sounds and intact distal pulses.  Exam reveals no gallop and no friction rub.   No murmur heard. RRR, nl s1/s2, no m/r/g. Distal pulses intact and equal bilaterally. No pedal edema.  Pulmonary/Chest: Effort normal and breath sounds normal. No respiratory distress. She has no decreased breath sounds. She has no wheezes. She has no rhonchi. She has no rales. She exhibits no tenderness, no crepitus, no deformity and no retraction.  CTAB in all lung fields, no w/r/r, no decreased breath sounds, no increased WOB, SpO2 100% on RA Chest wall nonTTP without retraction or deformity  Abdominal: Soft. Normal appearance and bowel sounds are normal. She exhibits no distension. There is no tenderness. There is no rigidity, no rebound, no guarding, no CVA tenderness, no tenderness at McBurney's point and negative Murphy's sign.  Soft, NTND, +BS throughout, no r/g/r, neg murphy's, neg mcburney's, no CVA TTP  Musculoskeletal: Normal range of motion.  MAE x4 Strength 5/5 in all extremities Sensation grossly intact in all extremities  Neurological: She is alert and oriented to person, place, and time. She has  normal strength. No sensory deficit.  No focal neuro deficits  Skin: Skin is warm, dry and intact. No rash noted.  Psychiatric: She has a normal mood and affect.  Nursing note and vitals reviewed.   ED Course  Procedures (including critical care  time) Labs Review Labs Reviewed  CBC - Abnormal; Notable for the following:    HCT 35.3 (*)    All other components within normal limits  BASIC METABOLIC PANEL - Abnormal; Notable for the following:    Glucose, Bld 103 (*)    GFR calc non Af Amer 77 (*)    GFR calc Af Amer 89 (*)    All other components within normal limits  I-STAT TROPOININ, ED  Rosezena SensorI-STAT TROPOININ, ED    Imaging Review Dg Chest 2 View  11/20/2014   CLINICAL DATA:  Shortness of breath with chest pain  EXAM: CHEST  2 VIEW  COMPARISON:  None.  FINDINGS: The heart size and mediastinal contours are within normal limits. Both lungs are clear. The visualized skeletal structures are unremarkable.  IMPRESSION: No active cardiopulmonary disease.   Electronically Signed   By: Alcide CleverMark  Lukens M.D.   On: 11/20/2014 21:58     EKG Interpretation None    EKG: NSR, RAD  MDM   Final diagnoses:  Chest tightness  Paresthesia of left arm    33 y.o. female with chest tightness intermittently x6434yr but the became constant around 6:45pm with associated L arm paresthesia which resolved PTA. Clear lung exam, cardiac exam benign, no s/sx DVT, no reproducible pain on exam. PERC neg, doubt DVT/PE. No significant cardiac history in herself or her family. CBC WNL, BMP WNL, EKG with NSR, and CXR unremarkable. Heart score 0. Will attempt to give fluids, ativan, GI cocktail, and morphine to help with pain. Will get second troponin now (3hrs after first). Highly doubt cardiac etiology, and if negative then likely d/c home with PCP f/up. Unclear etiology at this time. Will reassess shortly.   11:12 PM Second trop neg. Pain improved with GI cocktail, ativan, and morphine. VSS. Discussed that she will need to establish care with a PCP (states she doesn't like her prior PCP, and would like to change) in order to have ongoing care of her chronic chest tightness. Doubt need for serial cardiac enzyme studies or observation admission, doubt need for cardiac  consult or f/up now. Discussed staying well hydrated. Will give small amount of ativan for home to see if this helps with her symptoms. Discussed using OTC pepcid/zantac/tums/maalox for any indigestion or recurrent symptoms since she uses NSAIDs regularly and this could be silent reflux. Will have her use tylenol or motrin for any pain. Patient is to be discharged with recommendation to follow up with PCP in regards to today's hospital visit. Pts symptoms unlikely to be of CAD etiology and pt has not reported any CP while in my care. Labs and imaging reviewed again prior to dc. CXR and ECG with no acute abnormalities, and neg troponins x2. Pt has been advised return to the ED if develop any exertional CP- strict return precautions discussed & patient's questions answered. Pt appears reliable for follow up and is agreeable to discharge.   BP 136/79 mmHg  Pulse 64  Temp(Src) 98.3 F (36.8 C) (Oral)  Resp 13  SpO2 100%  LMP 11/19/2014  Meds ordered this encounter  Medications  . LORazepam (ATIVAN) injection 0.5 mg    Sig:   . sodium chloride 0.9 % bolus 1,000 mL  Sig:   . gi cocktail (Maalox,Lidocaine,Donnatal)    Sig:   . morphine 4 MG/ML injection 4 mg    Sig:   . LORazepam (ATIVAN) 1 MG tablet    Sig: Take 1 tablet (1 mg total) by mouth every 6 (six) hours as needed for anxiety (or chest tightness).    Dispense:  10 tablet    Refill:  0    Order Specific Question:  Supervising Provider    Answer:  Eber HongMILLER, BRIAN D [[id number]]  . ranitidine (ZANTAC) 150 MG tablet    Sig: Take 1 tablet (150 mg total) by mouth 2 (two) times daily as needed for heartburn (or chest tightness).    Dispense:  30 tablet    Refill:  0    Order Specific Question:  Supervising Provider    Answer:  Vida RollerMILLER, BRIAN D [id number][id number]]      Tinlee Navarrette Strupp Camprubi-Soms, PA-C 11/20/14 2321  Dione Boozeavid Glick, MD 11/20/14 2324  Dione Boozeavid Glick, MD 11/20/14 2325

## 2015-03-12 ENCOUNTER — Emergency Department (HOSPITAL_COMMUNITY)
Admission: EM | Admit: 2015-03-12 | Discharge: 2015-03-12 | Disposition: A | Discharge status: Home / Self Care | Payer: No Typology Code available for payment source | Attending: Emergency Medicine | Admitting: Emergency Medicine

## 2015-03-12 ENCOUNTER — Encounter (HOSPITAL_COMMUNITY): Payer: Self-pay | Admitting: Neurology

## 2015-03-12 DIAGNOSIS — R5381 Other malaise: Secondary | ICD-10-CM | POA: Insufficient documentation

## 2015-03-12 DIAGNOSIS — Z3202 Encounter for pregnancy test, result negative: Secondary | ICD-10-CM | POA: Insufficient documentation

## 2015-03-12 DIAGNOSIS — R51 Headache: Secondary | ICD-10-CM | POA: Insufficient documentation

## 2015-03-12 DIAGNOSIS — R519 Headache, unspecified: Secondary | ICD-10-CM

## 2015-03-12 DIAGNOSIS — R110 Nausea: Secondary | ICD-10-CM | POA: Insufficient documentation

## 2015-03-12 DIAGNOSIS — R197 Diarrhea, unspecified: Secondary | ICD-10-CM | POA: Insufficient documentation

## 2015-03-12 LAB — BASIC METABOLIC PANEL
ANION GAP: 5 (ref 5–15)
BUN: 6 mg/dL (ref 6–23)
CO2: 26 mmol/L (ref 19–32)
CREATININE: 0.93 mg/dL (ref 0.50–1.10)
Calcium: 9.2 mg/dL (ref 8.4–10.5)
Chloride: 107 mmol/L (ref 96–112)
GFR, EST NON AFRICAN AMERICAN: 79 mL/min — AB (ref >90)
Glucose, Bld: 100 mg/dL — ABNORMAL HIGH (ref 70–99)
POTASSIUM: 3.4 mmol/L — AB (ref 3.5–5.1)
Sodium: 138 mmol/L (ref 135–145)

## 2015-03-12 LAB — URINALYSIS, ROUTINE W REFLEX MICROSCOPIC
BILIRUBIN URINE: NEGATIVE
Glucose, UA: NEGATIVE mg/dL
Hgb urine dipstick: NEGATIVE
Ketones, ur: NEGATIVE mg/dL
Leukocytes, UA: NEGATIVE
Nitrite: NEGATIVE
PROTEIN: NEGATIVE mg/dL
Specific Gravity, Urine: 1.014 (ref 1.005–1.030)
UROBILINOGEN UA: 1 mg/dL (ref 0.0–1.0)
pH: 6.5 (ref 5.0–8.0)

## 2015-03-12 LAB — CBC
HEMATOCRIT: 36.4 % (ref 36.0–46.0)
HEMOGLOBIN: 12.2 g/dL (ref 12.0–15.0)
MCH: 28.4 pg (ref 26.0–34.0)
MCHC: 33.5 g/dL (ref 30.0–36.0)
MCV: 84.7 fL (ref 78.0–100.0)
PLATELETS: 220 10*3/uL (ref 150–400)
RBC: 4.3 MIL/uL (ref 3.87–5.11)
RDW: 13.6 % (ref 11.5–15.5)
WBC: 5.1 10*3/uL (ref 4.0–10.5)

## 2015-03-12 LAB — POC URINE PREG, ED: Preg Test, Ur: NEGATIVE

## 2015-03-12 MED ORDER — KETOROLAC TROMETHAMINE 30 MG/ML IJ SOLN
15.0000 mg | Freq: Once | INTRAMUSCULAR | Status: AC
  Administered 2015-03-12: 15 mg via INTRAVENOUS
  Filled 2015-03-12: qty 1

## 2015-03-12 MED ORDER — ONDANSETRON 4 MG PO TBDP: ORAL_TABLET | ORAL | Status: AC

## 2015-03-12 MED ORDER — SODIUM CHLORIDE 0.9 % IV BOLUS (SEPSIS)
1000.0000 mL | Freq: Once | INTRAVENOUS | Status: AC
  Administered 2015-03-12: 1000 mL via INTRAVENOUS

## 2015-03-12 MED ORDER — ONDANSETRON HCL 4 MG/2ML IJ SOLN
4.0000 mg | Freq: Once | INTRAMUSCULAR | Status: AC
  Administered 2015-03-12: 4 mg via INTRAVENOUS
  Filled 2015-03-12: qty 2

## 2015-03-12 NOTE — ED Provider Notes (Signed)
CSN: [id number]     Arrival date & time 03/12/15  1651 History   First MD Initiated Contact with Patient 03/12/15 1806     Chief Complaint  Patient presents with  . Headache  . Weakness     (Consider location/radiation/quality/duration/timing/severity/associated sxs/prior Treatment) Patient is a 34 y.o. female presenting with migraines.  Migraine This is a new problem. The current episode started 12 to 24 hours ago. The problem occurs constantly. The problem has not changed since onset.Pertinent negatives include no chest pain, no abdominal pain, no headaches and no shortness of breath. Associated symptoms comments: Diarrhea, nausea. Nothing aggravates the symptoms. Nothing relieves the symptoms. She has tried nothing for the symptoms. The treatment provided no relief.    History reviewed. No pertinent past medical history. Past Surgical History  Procedure Laterality Date  . Tubal ligation     No family history on file. History  Substance Use Topics  . Smoking status: Never Smoker   . Smokeless tobacco: Not on file  . Alcohol Use: No   OB History    No data available     Review of Systems  Respiratory: Negative for shortness of breath.   Cardiovascular: Negative for chest pain.  Gastrointestinal: Negative for abdominal pain.  Neurological: Negative for headaches.  All other systems reviewed and are negative.     Allergies  Review of patient's allergies indicates no known allergies.  Home Medications   Prior to Admission medications   Medication Sig Start Date End Date Taking? Authorizing Provider  Aspirin-Acetaminophen (GOODYS BODY PAIN PO) Take 1 packet by mouth daily as needed (pain).    Historical Provider, MD  aspirin-acetaminophen-caffeine (EXCEDRIN MIGRAINE) [phone] MG per tablet Take 2 tablets by mouth every 6 (six) hours as needed for headache.    Historical Provider, MD  azithromycin (ZITHROMAX) 250 MG tablet Take all 4 tablets today Patient not taking:  Reported on 11/20/2014 06/10/13   Hayden Rasmussen, NP  LORazepam (ATIVAN) 1 MG tablet Take 1 tablet (1 mg total) by mouth every 6 (six) hours as needed for anxiety (or chest tightness). 11/20/14   Mercedes Camprubi-Soms, PA-C  naproxen (EC-NAPROSYN) 500 MG EC tablet Take 1 tablet (500 mg total) by mouth 2 (two) times daily with a meal. Prn cramps. Patient not taking: Reported on 11/20/2014 06/10/13   Hayden Rasmussen, NP  ondansetron (ZOFRAN ODT) 4 MG disintegrating tablet  ODT q4 hours prn nausea/vomit 03/12/15   Mirian Mo, MD  ranitidine (ZANTAC) 150 MG tablet Take 1 tablet (150 mg total) by mouth 2 (two) times daily as needed for heartburn (or chest tightness). 11/20/14   Mercedes Camprubi-Soms, PA-C   BP 129/74 mmHg  Pulse 58  Temp(Src) 98.1 F (36.7 C)  Resp 20  Ht  (1.753 m)  Wt 180 lb (81.647 kg)  BMI 26.57 kg/m2  SpO2 100%  LMP 03/04/2015 Physical Exam  Constitutional: She is oriented to person, place, and time. She appears well-developed and well-nourished.  HENT:  Head: Normocephalic and atraumatic.  Right Ear: External ear normal.  Left Ear: External ear normal.  Eyes: Conjunctivae and EOM are normal. Pupils are equal, round, and reactive to light.  Neck: Normal range of motion. Neck supple.  Cardiovascular: Normal rate, regular rhythm, normal heart sounds and intact distal pulses.   Pulmonary/Chest: Effort normal and breath sounds normal.  Abdominal: Soft. Bowel sounds are normal. There is no tenderness.  Musculoskeletal: Normal range of motion.  Neurological: She is alert and oriented to person, place, and  time.  Skin: Skin is warm and dry.  Vitals reviewed.   ED Course  Procedures (including critical care time) Labs Review Labs Reviewed  BASIC METABOLIC PANEL - Abnormal; Notable for the following:    Potassium 3.4 (*)    Glucose, Bld 100 (*)    GFR calc non Af Amer 79 (*)    All other components within normal limits  CBC  URINALYSIS, ROUTINE W REFLEX MICROSCOPIC   POC URINE PREG, ED    Imaging Review No results found.   EKG Interpretation None      MDM   Final diagnoses:  Acute nonintractable headache, unspecified headache type  Diarrhea    34 y.o. female without pertinent PMH presents with ha, malaise, nausea, diarrhea x 24 hours.  On arrival vitals and physical exam as above.  Pt states that she has had ha in the past.  No concerning features of current ha, likely dehydration mediated.    Given NS bolus, symptoms relieved.  Stable to dc home with PCP fu.    I have reviewed all laboratory and imaging studies if ordered as above  1. Acute nonintractable headache, unspecified headache type   2. Diarrhea         Mirian MoMatthew Jadelin Eng, MD 03/12/15 2025

## 2015-03-12 NOTE — ED Notes (Addendum)
Pt reports h/a, generalized weakness for several days. Today diarrhea. Pt is a x 4. Has frequent urination.

## 2015-03-12 NOTE — Discharge Instructions (Signed)

## 2016-01-15 IMAGING — DX DG CHEST 2V
2 series · 2 of 2 positions shown · non-contrast
Comparison: None.

CLINICAL DATA: Shortness of breath with chest pain

EXAM:
CHEST  2 VIEW

[chest pa]
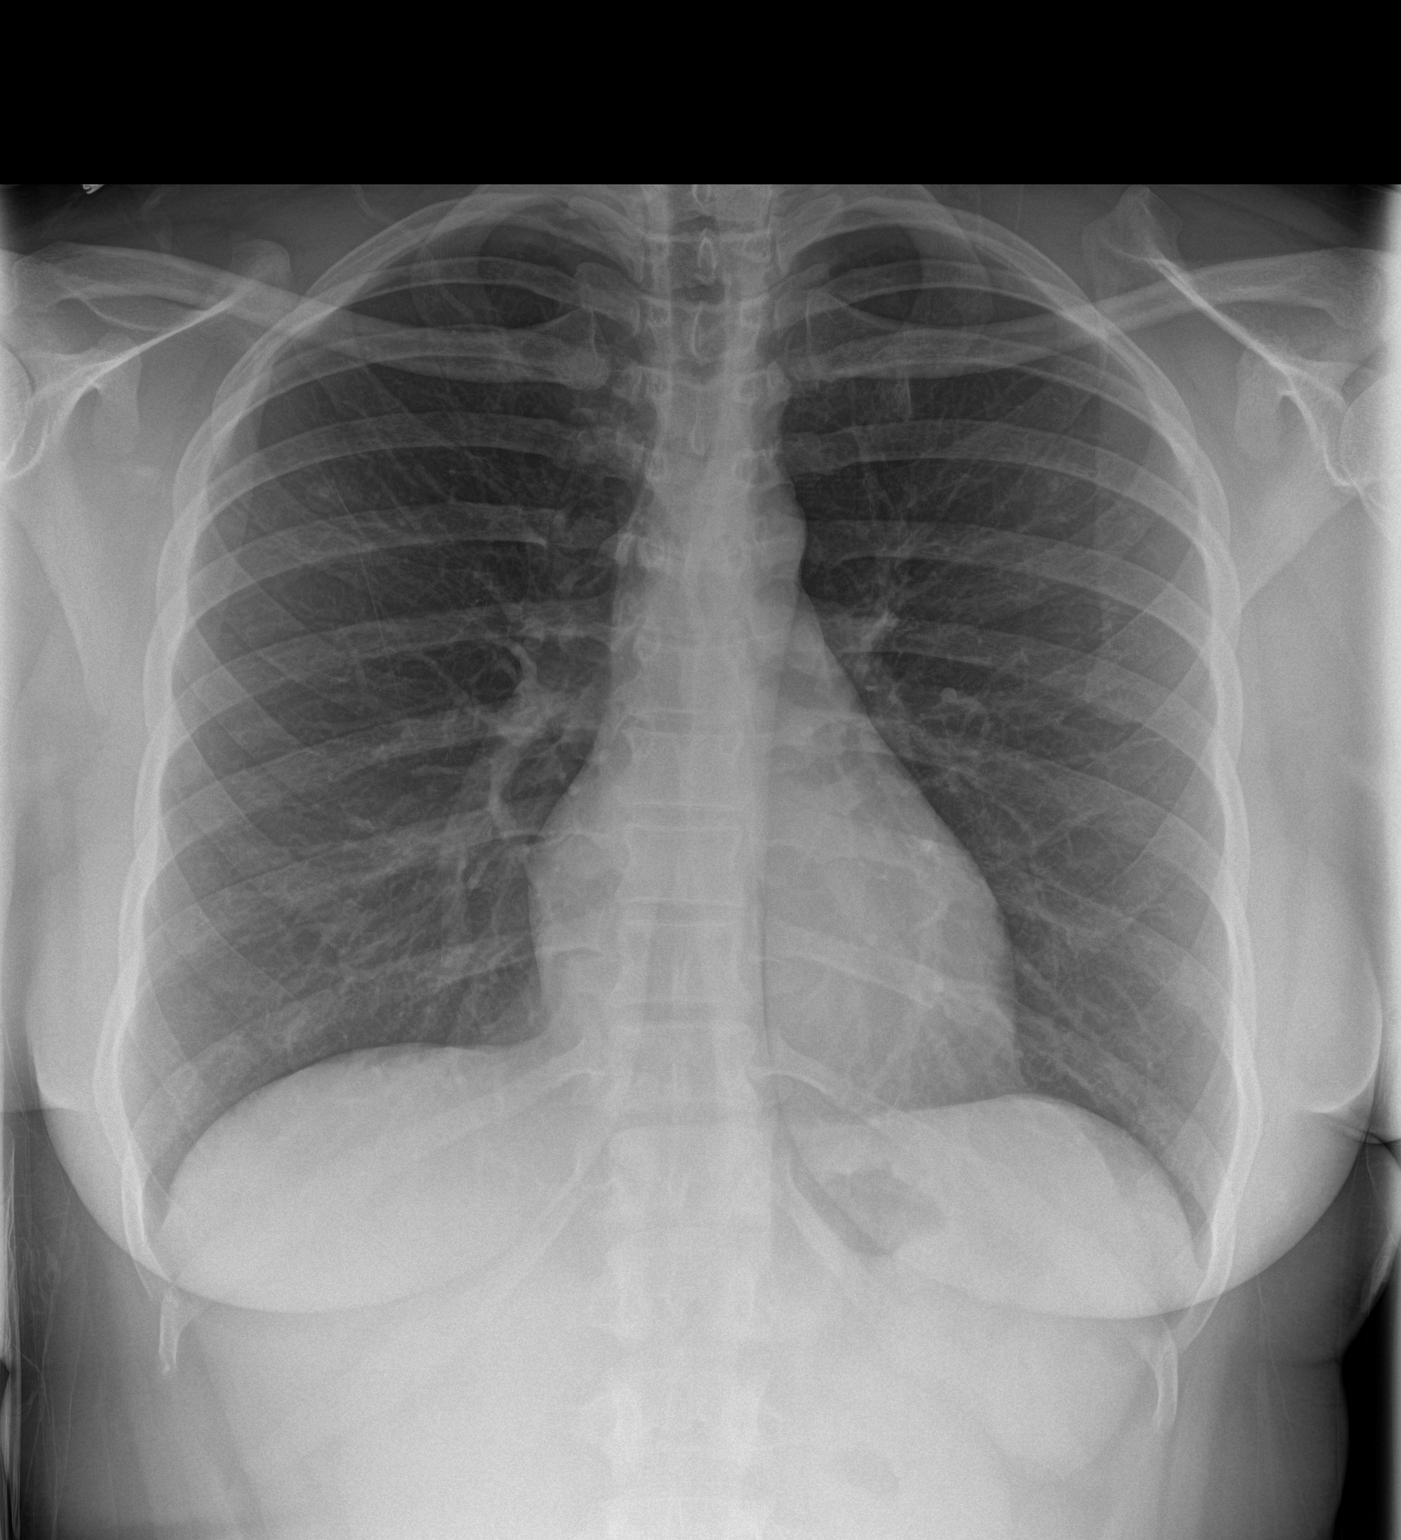

[chest lat]
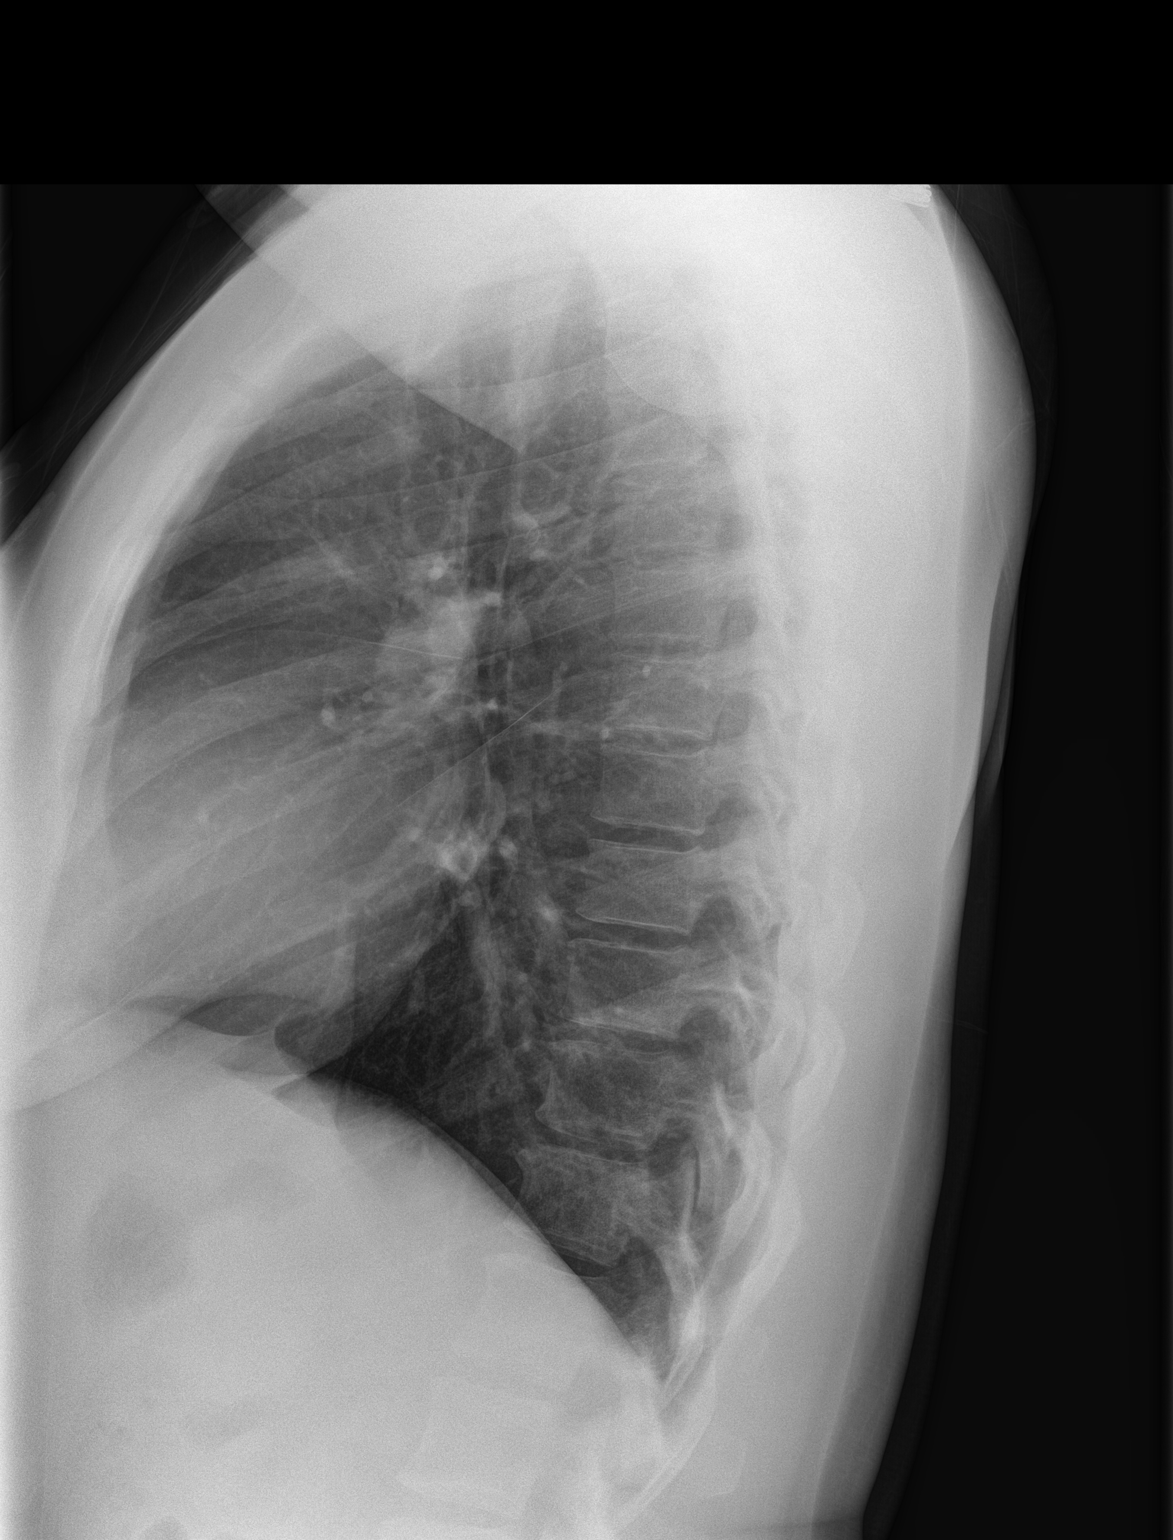

[2 of 2 positions shown; findings below may reference images not displayed]

FINDINGS: The heart size and mediastinal contours are within normal limits.
Both lungs are clear. The visualized skeletal structures are
unremarkable.
IMPRESSION: No active cardiopulmonary disease.
